# Patient Record
Sex: Female | Born: 1975 | Race: Black or African American | Hispanic: No | Marital: Single | Smoking: Never smoker
Health system: Southern US, Community
[De-identification: ages and names within clinical notes are randomized; demographics above are authoritative.]

---

## 2016-05-29 ENCOUNTER — Encounter (HOSPITAL_COMMUNITY): Payer: Self-pay | Admitting: *Deleted

## 2016-05-29 ENCOUNTER — Emergency Department (HOSPITAL_COMMUNITY)
Admission: EM | Admit: 2016-05-29 | Discharge: 2016-05-29 | Disposition: A | Discharge status: Home / Self Care | Payer: Self-pay | Attending: Emergency Medicine | Admitting: Emergency Medicine

## 2016-05-29 ENCOUNTER — Emergency Department (HOSPITAL_COMMUNITY): Payer: Self-pay

## 2016-05-29 ENCOUNTER — Other Ambulatory Visit: Payer: Self-pay

## 2016-05-29 DIAGNOSIS — Z791 Long term (current) use of non-steroidal anti-inflammatories (NSAID): Secondary | ICD-10-CM | POA: Insufficient documentation

## 2016-05-29 DIAGNOSIS — K219 Gastro-esophageal reflux disease without esophagitis: Secondary | ICD-10-CM

## 2016-05-29 DIAGNOSIS — N760 Acute vaginitis: Secondary | ICD-10-CM | POA: Insufficient documentation

## 2016-05-29 DIAGNOSIS — K029 Dental caries, unspecified: Secondary | ICD-10-CM

## 2016-05-29 DIAGNOSIS — R0789 Other chest pain: Secondary | ICD-10-CM

## 2016-05-29 DIAGNOSIS — B9689 Other specified bacterial agents as the cause of diseases classified elsewhere: Secondary | ICD-10-CM

## 2016-05-29 LAB — COMPREHENSIVE METABOLIC PANEL
ALT: 16 U/L (ref 14–54)
ANION GAP: 6 (ref 5–15)
AST: 15 U/L (ref 15–41)
Albumin: 3.7 g/dL (ref 3.5–5.0)
Alkaline Phosphatase: 33 U/L — ABNORMAL LOW (ref 38–126)
BUN: 17 mg/dL (ref 6–20)
CHLORIDE: 105 mmol/L (ref 101–111)
CO2: 25 mmol/L (ref 22–32)
CREATININE: 0.82 mg/dL (ref 0.44–1.00)
Calcium: 8.7 mg/dL — ABNORMAL LOW (ref 8.9–10.3)
Glucose, Bld: 99 mg/dL (ref 65–99)
POTASSIUM: 3.7 mmol/L (ref 3.5–5.1)
SODIUM: 136 mmol/L (ref 135–145)
Total Bilirubin: 0.7 mg/dL (ref 0.3–1.2)
Total Protein: 6.9 g/dL (ref 6.5–8.1)

## 2016-05-29 LAB — GC/CHLAMYDIA PROBE AMP (~~LOC~~) NOT AT ARMC
CHLAMYDIA, DNA PROBE: NEGATIVE
NEISSERIA GONORRHEA: NEGATIVE

## 2016-05-29 LAB — URINALYSIS, ROUTINE W REFLEX MICROSCOPIC
BILIRUBIN URINE: NEGATIVE
Glucose, UA: NEGATIVE mg/dL
Ketones, ur: NEGATIVE mg/dL
LEUKOCYTES UA: NEGATIVE
NITRITE: NEGATIVE
PH: 7 (ref 5.0–8.0)
Protein, ur: NEGATIVE mg/dL
Specific Gravity, Urine: 1.017 (ref 1.005–1.030)

## 2016-05-29 LAB — CBC
HCT: 36.7 % (ref 36.0–46.0)
HEMOGLOBIN: 12.1 g/dL (ref 12.0–15.0)
MCH: 26.8 pg (ref 26.0–34.0)
MCHC: 33 g/dL (ref 30.0–36.0)
MCV: 81.4 fL (ref 78.0–100.0)
PLATELETS: 211 10*3/uL (ref 150–400)
RBC: 4.51 MIL/uL (ref 3.87–5.11)
RDW: 14 % (ref 11.5–15.5)
WBC: 5.6 10*3/uL (ref 4.0–10.5)

## 2016-05-29 LAB — WET PREP, GENITAL
Sperm: NONE SEEN
TRICH WET PREP: NONE SEEN
YEAST WET PREP: NONE SEEN

## 2016-05-29 LAB — URINE MICROSCOPIC-ADD ON

## 2016-05-29 LAB — I-STAT TROPONIN, ED: TROPONIN I, POC: 0 ng/mL (ref 0.00–0.08)

## 2016-05-29 LAB — I-STAT BETA HCG BLOOD, ED (MC, WL, AP ONLY)

## 2016-05-29 MED ORDER — OMEPRAZOLE 20 MG PO CPDR: 20.0000 mg | DELAYED_RELEASE_CAPSULE | Freq: Every day | ORAL | Status: AC

## 2016-05-29 MED ORDER — AZITHROMYCIN 250 MG PO TABS
1000.0000 mg | ORAL_TABLET | Freq: Once | ORAL | Status: AC
  Administered 2016-05-29: 1000 mg via ORAL
  Filled 2016-05-29: qty 4

## 2016-05-29 MED ORDER — ACETAMINOPHEN 325 MG PO TABS
650.0000 mg | ORAL_TABLET | Freq: Once | ORAL | Status: AC
  Administered 2016-05-29: 650 mg via ORAL
  Filled 2016-05-29: qty 2

## 2016-05-29 MED ORDER — METRONIDAZOLE 500 MG PO TABS: 500.0000 mg | ORAL_TABLET | Freq: Two times a day (BID) | ORAL | Status: DC

## 2016-05-29 MED ORDER — CEFTRIAXONE SODIUM 1 G IJ SOLR
500.0000 mg | Freq: Once | INTRAMUSCULAR | Status: AC
  Administered 2016-05-29: 500 mg via INTRAMUSCULAR
  Filled 2016-05-29: qty 10

## 2016-05-29 MED ORDER — PENICILLIN V POTASSIUM 500 MG PO TABS: 500.0000 mg | ORAL_TABLET | Freq: Four times a day (QID) | ORAL | Status: AC

## 2016-05-29 MED ORDER — GI COCKTAIL ~~LOC~~
30.0000 mL | Freq: Once | ORAL | Status: AC
  Administered 2016-05-29: 30 mL via ORAL
  Filled 2016-05-29: qty 30

## 2016-05-29 NOTE — ED Notes (Signed)
Pelvic cart ready at bedside 

## 2016-05-29 NOTE — Discharge Instructions (Signed)
In regards to your dental pain, you will need to see a dentist for follow-up regarding definitive management. Please take tylenol for pain control. Take penicillin for potential underlying infection  In regards to your chest pain, your x-ray, blood work and heart conduction looks normal. It may be related to heart burn. Decrease your intake of aleve/ibuprofen and only take with food/crackers. You are started on prilosec.  We sent pelvic testing on you. Take antibiotics for BV. Return for fever, worsening pain, or any other symptoms concerning to you.  Bacterial Vaginosis Bacterial vaginosis is a vaginal infection that occurs when the normal balance of bacteria in the vagina is disrupted. It results from an overgrowth of certain bacteria. This is the most common vaginal infection in women of childbearing age. Treatment is important to prevent complications, especially in pregnant women, as it can cause a premature delivery. CAUSES  Bacterial vaginosis is caused by an increase in harmful bacteria that are normally present in smaller amounts in the vagina. Several different kinds of bacteria can cause bacterial vaginosis. However, the reason that the condition develops is not fully understood. RISK FACTORS Certain activities or behaviors can put you at an increased risk of developing bacterial vaginosis, including:  Having a new sex partner or multiple sex partners.  Douching.  Using an intrauterine device (IUD) for contraception. Women do not get bacterial vaginosis from toilet seats, bedding, swimming pools, or contact with objects around them. SIGNS AND SYMPTOMS  Some women with bacterial vaginosis have no signs or symptoms. Common symptoms include:  Grey vaginal discharge.  A fishlike odor with discharge, especially after sexual intercourse.  Itching or burning of the vagina and vulva.  Burning or pain with urination. DIAGNOSIS  Your health care provider will take a medical history and  examine the vagina for signs of bacterial vaginosis. A sample of vaginal fluid may be taken. Your health care provider will look at this sample under a microscope to check for bacteria and abnormal cells. A vaginal pH test may also be done.  TREATMENT  Bacterial vaginosis may be treated with antibiotic medicines. These may be given in the form of a pill or a vaginal cream. A second round of antibiotics may be prescribed if the condition comes back after treatment. Because bacterial vaginosis increases your risk for sexually transmitted diseases, getting treated can help reduce your risk for chlamydia, gonorrhea, HIV, and herpes. HOME CARE INSTRUCTIONS   Only take over-the-counter or prescription medicines as directed by your health care provider.  If antibiotic medicine was prescribed, take it as directed. Make sure you finish it even if you start to feel better.  Tell all sexual partners that you have a vaginal infection. They should see their health care provider and be treated if they have problems, such as a mild rash or itching.  During treatment, it is important that you follow these instructions:  Avoid sexual activity or use condoms correctly.  Do not douche.  Avoid alcohol as directed by your health care provider.  Avoid breastfeeding as directed by your health care provider. SEEK MEDICAL CARE IF:   Your symptoms are not improving after 3 days of treatment.  You have increased discharge or pain.  You have a fever. MAKE SURE YOU:   Understand these instructions.  Will watch your condition.  Will get help right away if you are not doing well or get worse. FOR MORE INFORMATION  Centers for Disease Control and Prevention, Division of STD Prevention: [employer] American  Sexual Health Association (ASHA): www.ashastd.org    This information is not intended to replace advice given to you by your health care provider. Make sure you discuss any questions you have with your  health care provider.   Document Released: 11/25/2005 Document Revised: 12/16/2014 Document Reviewed: 07/07/2013 Elsevier Interactive Patient Education 2016 Elsevier Inc.  Dental Caries Dental caries (also called tooth decay) is the most common oral disease. It can occur at any age but is more common in children and young adults.  HOW DENTAL CARIES DEVELOPS  The process of decay begins when bacteria and foods (particularly sugars and starches) combine in your mouth to produce plaque. Plaque is a substance that sticks to the hard, outer surface of a tooth (enamel). The bacteria in plaque produce acids that attack enamel. These acids may also attack the root surface of a tooth (cementum) if it is exposed. Repeated attacks dissolve these surfaces and create holes in the tooth (cavities). If left untreated, the acids destroy the other layers of the tooth.  RISK FACTORS  Frequent sipping of sugary beverages.   Frequent snacking on sugary and starchy foods, especially those that easily get stuck in the teeth.   Poor oral hygiene.   Dry mouth.   Substance abuse such as methamphetamine abuse.   Broken or poor-fitting dental restorations.   Eating disorders.   Gastroesophageal reflux disease (GERD).   Certain radiation treatments to the head and neck. SYMPTOMS In the early stages of dental caries, symptoms are seldom present. Sometimes white, chalky areas may be seen on the enamel or other tooth layers. In later stages, symptoms may include:  Pits and holes on the enamel.  Toothache after sweet, hot, or cold foods or drinks are consumed.  Pain around the tooth.  Swelling around the tooth. DIAGNOSIS  Most of the time, dental caries is detected during a regular dental checkup. A diagnosis is made after a thorough medical and dental history is taken and the surfaces of your teeth are checked for signs of dental caries. Sometimes special instruments, such as lasers, are used to  check for dental caries. Dental X-ray exams may be taken so that areas not visible to the eye (such as between the contact areas of the teeth) can be checked for cavities.  TREATMENT  If dental caries is in its early stages, it may be reversed with a fluoride treatment or an application of a remineralizing agent at the dental office. Thorough brushing and flossing at home is needed to aid these treatments. If it is in its later stages, treatment depends on the location and extent of tooth destruction:   If a small area of the tooth has been destroyed, the destroyed area will be removed and cavities will be filled with a material such as gold, silver amalgam, or composite resin.   If a large area of the tooth has been destroyed, the destroyed area will be removed and a cap (crown) will be fitted over the remaining tooth structure.   If the center part of the tooth (pulp) is affected, a procedure called a root canal will be needed before a filling or crown can be placed.   If most of the tooth has been destroyed, the tooth may need to be pulled (extracted). HOME CARE INSTRUCTIONS You can prevent, stop, or reverse dental caries at home by practicing good oral hygiene. Good oral hygiene includes:  Thoroughly cleaning your teeth at least twice a day with a toothbrush and dental floss.  Using a fluoride toothpaste. A fluoride mouth rinse may also be used if recommended by your dentist or health care provider.   Restricting the amount of sugary and starchy foods and sugary liquids you consume.   Avoiding frequent snacking on these foods and sipping of these liquids.   Keeping regular visits with a dentist for checkups and cleanings. PREVENTION   Practice good oral hygiene.  Consider a dental sealant. A dental sealant is a coating material that is applied by your dentist to the pits and grooves of teeth. The sealant prevents food from being trapped in them. It may protect the teeth for  several years.  Ask about fluoride supplements if you live in a community without fluorinated water or with water that has a low fluoride content. Use fluoride supplements as directed by your dentist or health care provider.  Allow fluoride varnish applications to teeth if directed by your dentist or health care provider.   This information is not intended to replace advice given to you by your health care provider. Make sure you discuss any questions you have with your health care provider.   Document Released: 08/17/2002 Document Revised: 12/16/2014 Document Reviewed: 11/27/2012 Elsevier Interactive Patient Education 2016 Elsevier Inc.  Heartburn Heartburn is a type of pain or discomfort that can happen in the throat or chest. It is often described as a burning pain. It may also cause a bad taste in the mouth. Heartburn may feel worse when you lie down or bend over, and it is often worse at night. Heartburn may be caused by stomach contents that move back up into the esophagus (reflux). HOME CARE INSTRUCTIONS Take these actions to decrease your discomfort and to help avoid complications. Diet  Follow a diet as recommended by your health care provider. This may involve avoiding foods and drinks such as:  Coffee and tea (with or without caffeine).  Drinks that contain alcohol.  Energy drinks and sports drinks.  Carbonated drinks or sodas.  Chocolate and cocoa.  Peppermint and mint flavorings.  Garlic and onions.  Horseradish.  Spicy and acidic foods, including peppers, chili powder, curry powder, vinegar, hot sauces, and barbecue sauce.  Citrus fruit juices and citrus fruits, such as oranges, lemons, and limes.  Tomato-based foods, such as red sauce, chili, salsa, and pizza with red sauce.  Fried and fatty foods, such as donuts, french fries, potato chips, and high-fat dressings.  High-fat meats, such as hot dogs and fatty cuts of red and white meats, such as rib eye  steak, sausage, ham, and bacon.  High-fat dairy items, such as whole milk, butter, and cream cheese.  Eat small, frequent meals instead of large meals.  Avoid drinking large amounts of liquid with your meals.  Avoid eating meals during the 2-3 hours before bedtime.  Avoid lying down right after you eat.  Do not exercise right after you eat. General Instructions  Pay attention to any changes in your symptoms.  Take over-the-counter and prescription medicines only as told by your health care provider. Do not take aspirin, ibuprofen, or other NSAIDs unless your health care provider told you to do so.  Do not use any tobacco products, including cigarettes, chewing tobacco, and e-cigarettes. If you need help quitting, ask your health care provider.  Wear loose-fitting clothing. Do not wear anything tight around your waist that causes pressure on your abdomen.  Raise (elevate) the head of your bed about 6 inches (15 cm).  Try to reduce your stress, such  as with yoga or meditation. If you need help reducing stress, ask your health care provider.  If you are overweight, reduce your weight to an amount that is healthy for you. Ask your health care provider for guidance about a safe weight loss goal.  Keep all follow-up visits as told by your health care provider. This is important. SEEK MEDICAL CARE IF:  You have new symptoms.  You have unexplained weight loss.  You have difficulty swallowing, or it hurts to swallow.  You have wheezing or a persistent cough.  Your symptoms do not improve with treatment.  You have frequent heartburn for more than two weeks. SEEK IMMEDIATE MEDICAL CARE IF:  You have pain in your arms, neck, jaw, teeth, or back.  You feel sweaty, dizzy, or light-headed.  You have chest pain or shortness of breath.  You vomit and your vomit looks like blood or coffee grounds.  Your stool is bloody or black.   This information is not intended to replace  advice given to you by your health care provider. Make sure you discuss any questions you have with your health care provider.   Document Released: 04/13/2009 Document Revised: 08/16/2015 Document Reviewed: 03/22/2015 Elsevier Interactive Patient Education 2016 Elsevier Inc.  Nonspecific Chest Pain It is often hard to find the cause of chest pain. There is always a chance that your pain could be related to something serious, such as a heart attack or a blood clot in your lungs. Chest pain can also be caused by conditions that are not life-threatening. If you have chest pain, it is very important to follow up with your doctor.  HOME CARE  If you were prescribed an antibiotic medicine, finish it all even if you start to feel better.  Avoid any activities that cause chest pain.  Do not use any tobacco products, including cigarettes, chewing tobacco, or electronic cigarettes. If you need help quitting, ask your doctor.  Do not drink alcohol.  Take medicines only as told by your doctor.  Keep all follow-up visits as told by your doctor. This is important. This includes any further testing if your chest pain does not go away.  Your doctor may tell you to keep your head raised (elevated) while you sleep.  Make lifestyle changes as told by your doctor. These may include:  Getting regular exercise. Ask your doctor to suggest some activities that are safe for you.  Eating a heart-healthy diet. Your doctor or a diet specialist (dietitian) can help you to learn healthy eating options.  Maintaining a healthy weight.  Managing diabetes, if necessary.  Reducing stress. GET HELP IF:  Your chest pain does not go away, even after treatment.  You have a rash with blisters on your chest.  You have a fever. GET HELP RIGHT AWAY IF:  Your chest pain is worse.  You have an increasing cough, or you cough up blood.  You have severe belly (abdominal) pain.  You feel extremely weak.  You  pass out (faint).  You have chills.  You have sudden, unexplained chest discomfort.  You have sudden, unexplained discomfort in your arms, back, neck, or jaw.  You have shortness of breath at any time.  You suddenly start to sweat, or your skin gets clammy.  You feel nauseous.  You vomit.  You suddenly feel light-headed or dizzy.  Your heart begins to beat quickly, or it feels like it is skipping beats. These symptoms may be an emergency. Do not wait to  see if the symptoms will go away. Get medical help right away. Call your local emergency services (911 in the [place] Do not drive yourself to the hospital.   This information is not intended to replace advice given to you by your health care provider. Make sure you discuss any questions you have with your health care provider.   Document Released: 05/13/2008 Document Revised: 12/16/2014 Document Reviewed: 07/01/2014 Elsevier Interactive Patient Education [employer]2016 Elsevier Inc.

## 2016-05-29 NOTE — ED Notes (Addendum)
Pt presents to ED with multiple complaints; she reports right upper dental pain for which she has been taking aleve without relief; this morning she states on her way to work she developed burning pain in central chest, sts she is unsure if it's heartburn, due to heavy meal she had night before. She also reports lower abdominal pain, as well as generalized body aches. She appears in no distress.

## 2016-05-29 NOTE — ED Provider Notes (Signed)
CSN: [id number]     Arrival date & time 05/29/16  0549 History   First MD Initiated Contact with Patient 05/29/16 0700     Chief Complaint  Patient presents with  . Chest Pain  . multiple complaints      (Consider location/radiation/quality/duration/timing/severity/associated sxs/prior Treatment) HPI  40 year old female, no significant past medical history, presenting with multiple complaints. Last night right dental pain followed by chest discomfort. Recently moved to the area and does not have a dentist. States after eating ice cream she began to develop dental pain in her right upper jaw. She took 2 tablets of Aleve prior to going to bed, and 2 tablets of Aleve during the night. States that around midnight she developed burning, squeezing chest discomfort while lying down. States that she felt short of breath and lightheaded. Symptoms worse with lying down. No exacerbated by activity. No nausea, vomiting, abdominal pain, fevers or chills, cough, syncope, diarrhea, melena or hematochezia.   Six days ago with intermittent pelvic discomfort. Notices pain intermittently after sexual intercourse. Is unsure if she has been exposed to STDs recently. No abnormal vaginal discharge or bleeding. No dysuria, urinary frequency, or hematuria.   History reviewed. No pertinent past medical history. History reviewed. No pertinent past surgical history. History reviewed. No pertinent family history. Social History  Substance Use Topics  . Smoking status: None  . Smokeless tobacco: None  . Alcohol Use: None   OB History    No data available     Review of Systems 10/14 systems reviewed and are negative other than those stated in the HPI    Allergies  Review of patient's allergies indicates no known allergies.  Home Medications   Prior to Admission medications   Medication Sig Start Date End Date Taking? Authorizing Provider  naproxen sodium (ANAPROX) 220 MG tablet Take 440 mg by mouth every  12 (twelve) hours as needed (for pain).   Yes Historical Provider, MD  metroNIDAZOLE (FLAGYL) 500 MG tablet Take 1 tablet (500 mg total) by mouth 2 (two) times daily. 05/29/16   Lavera Guise, MD  omeprazole (PRILOSEC) 20 MG capsule Take 1 capsule (20 mg total) by mouth daily. 05/29/16   Lavera Guise, MD   BP 114/61 mmHg  Pulse 63  Temp(Src) 98.4 F (36.9 C) (Oral)  Resp 18  Ht  (1.626 m)  Wt 180 lb (81.647 kg)  BMI 30.88 kg/m2  SpO2 94%  LMP 05/09/2016 Physical Exam Physical Exam  Nursing note and vitals reviewed. Constitutional: Well developed, well nourished, non-toxic, and in no acute distress Head: Normocephalic and atraumatic.  Mouth/Throat: Oropharynx is clear and moist. Severe avities noted over teeth #3 and #2 with tenderness to percussion Neck: Normal range of motion. Neck supple.  Cardiovascular: Normal rate and regular rhythm.   Pulmonary/Chest: Effort normal and breath sounds normal.  Abdominal: Soft. There is no tenderness. There is no rebound and no guarding.    Musculoskeletal: Normal range of motion.  Neurological: Alert, no facial droop, fluent speech, moves all extremities symmetrically Skin: Skin is warm and dry.  Psychiatric: Cooperative  ED Course  Procedures (including critical care time) Labs Review Labs Reviewed  WET PREP, GENITAL - Abnormal; Notable for the following:    Clue Cells Wet Prep HPF POC PRESENT (*)    WBC, Wet Prep HPF POC MANY (*)    All other components within normal limits  COMPREHENSIVE METABOLIC PANEL - Abnormal; Notable for the following:    Calcium 8.7 (*)  Alkaline Phosphatase 33 (*)    All other components within normal limits  URINALYSIS, ROUTINE W REFLEX MICROSCOPIC (NOT AT [facility]) - Abnormal; Notable for the following:    Hgb urine dipstick SMALL (*)    All other components within normal limits  URINE MICROSCOPIC-ADD ON - Abnormal; Notable for the following:    Squamous Epithelial / LPF 0-5 (*)    Bacteria, UA RARE  (*)    All other components within normal limits  CBC  I-STAT TROPOININ, ED  I-STAT BETA HCG BLOOD, ED ([facility], [facility], [facility] ONLY)  GC/CHLAMYDIA PROBE AMP ([place]) NOT AT [facility]    Imaging Review Dg Chest 2 View  05/29/2016  CLINICAL DATA:  40 year old female with chest pain EXAM: CHEST  2 VIEW COMPARISON:  None. FINDINGS: The heart size and mediastinal contours are within normal limits. Both lungs are clear. The visualized skeletal structures are unremarkable. IMPRESSION: No active cardiopulmonary disease. Electronically Signed   By: Elgie CollardArash  Radparvar M.D.   On: 05/29/2016 06:33   I have personally reviewed and evaluated these images and lab results as part of my medical decision-making.   EKG Interpretation   Date/Time:  Wednesday May 29 2016 05:59:57 EDT Ventricular Rate:  57 PR Interval:    QRS Duration: 112 QT Interval:  428 QTC Calculation: 417 R Axis:   -10 Text Interpretation:  Sinus rhythm Borderline intraventricular conduction  delay No acute ischemicchanges. No prior EKG for comparison  Confirmed by  Eliav Mechling MD, Ross Hefferan [phone]([id number]) on 05/29/2016 7:02:18 AM      MDM   Final diagnoses:  Atypical chest pain  Gastroesophageal reflux disease, esophagitis presence not specified  Pain due to dental caries  Bacterial vaginosis   Presenting with multiple complaints. In regards to her dental pain, she has dental caries throughout, but notable ones on tooth #2 and tooth #3 where she is having her pain. There is tenderness to percussion, without gingival swelling or obvious abscess. Discussed need for dental follow-up for this.  Unable to rule out apical abscess so will give course of PCN.  Her chest pain seems atypical for ACS. No risk factors. Heart score 0. Troponin negative, EKG non-ischemic, and CXR normal. No suspicious history of PE, dissection, or other emergent intrathoracic or cardiopulmoary process. Chest pain seems more suggestive of likely GERD/GI process, especially in setting of  occuring after taking multiple doses of NSAIDs. Given GI cocktail.   In regards to pelvic pain, intermittent since 6 days ago. UA and pelvic exam performed. Abdomen soft and benign. UA negative. Wet prep positive for BV and many WBCs. Request empiric treatment for STDs. No cervical motion tenderness or suspicion for PID at this time. Given flagyl, ceftriaxone IM x 1 and azithromycin x 1.   Lavera Guiseana Duo Keldric Poyer, MD 05/29/16 [phone]

## 2016-07-15 ENCOUNTER — Encounter (HOSPITAL_COMMUNITY): Payer: Self-pay | Admitting: Emergency Medicine

## 2016-07-15 ENCOUNTER — Emergency Department (HOSPITAL_COMMUNITY)
Admission: EM | Admit: 2016-07-15 | Discharge: 2016-07-15 | Disposition: A | Discharge status: Home / Self Care | Payer: Self-pay | Attending: Emergency Medicine | Admitting: Emergency Medicine

## 2016-07-15 ENCOUNTER — Emergency Department (HOSPITAL_COMMUNITY): Payer: Self-pay

## 2016-07-15 DIAGNOSIS — R079 Chest pain, unspecified: Secondary | ICD-10-CM | POA: Insufficient documentation

## 2016-07-15 LAB — COMPREHENSIVE METABOLIC PANEL
ALBUMIN: 3.8 g/dL (ref 3.5–5.0)
ALK PHOS: 38 U/L (ref 38–126)
ALT: 21 U/L (ref 14–54)
AST: 18 U/L (ref 15–41)
Anion gap: 6 (ref 5–15)
BUN: 13 mg/dL (ref 6–20)
CALCIUM: 9.1 mg/dL (ref 8.9–10.3)
CO2: 27 mmol/L (ref 22–32)
CREATININE: 1.05 mg/dL — AB (ref 0.44–1.00)
Chloride: 104 mmol/L (ref 101–111)
GFR calc Af Amer: >60 mL/min (ref >60)
GFR calc non Af Amer: >60 mL/min (ref >60)
GLUCOSE: 96 mg/dL (ref 65–99)
Potassium: 3.6 mmol/L (ref 3.5–5.1)
SODIUM: 137 mmol/L (ref 135–145)
Total Bilirubin: 0.4 mg/dL (ref 0.3–1.2)
Total Protein: 7 g/dL (ref 6.5–8.1)

## 2016-07-15 LAB — CBC WITH DIFFERENTIAL/PLATELET
BASOS PCT: 0 %
Basophils Absolute: 0 10*3/uL (ref 0.0–0.1)
EOS ABS: 0.3 10*3/uL (ref 0.0–0.7)
Eosinophils Relative: 4 %
HCT: 39.2 % (ref 36.0–46.0)
HEMOGLOBIN: 12.2 g/dL (ref 12.0–15.0)
LYMPHS ABS: 2.6 10*3/uL (ref 0.7–4.0)
Lymphocytes Relative: 33 %
MCH: 26 pg (ref 26.0–34.0)
MCHC: 31.1 g/dL (ref 30.0–36.0)
MCV: 83.6 fL (ref 78.0–100.0)
Monocytes Absolute: 0.9 10*3/uL (ref 0.1–1.0)
Monocytes Relative: 11 %
NEUTROS PCT: 52 %
Neutro Abs: 4.1 10*3/uL (ref 1.7–7.7)
Platelets: 252 10*3/uL (ref 150–400)
RBC: 4.69 MIL/uL (ref 3.87–5.11)
RDW: 14.4 % (ref 11.5–15.5)
WBC: 7.9 10*3/uL (ref 4.0–10.5)

## 2016-07-15 LAB — D-DIMER, QUANTITATIVE: D-Dimer, Quant: 0.3 ug/mL-FEU (ref 0.00–0.50)

## 2016-07-15 LAB — TROPONIN I: Troponin I: <0.03 ng/mL (ref <0.03)

## 2016-07-15 LAB — I-STAT BETA HCG BLOOD, ED (MC, WL, AP ONLY): I-stat hCG, quantitative: <5 m[IU]/mL (ref <5)

## 2016-07-15 LAB — LIPASE, BLOOD: Lipase: 30 U/L (ref 11–51)

## 2016-07-15 MED ORDER — ONDANSETRON HCL 4 MG/2ML IJ SOLN
4.0000 mg | Freq: Once | INTRAMUSCULAR | Status: AC
  Administered 2016-07-15: 4 mg via INTRAVENOUS
  Filled 2016-07-15: qty 2

## 2016-07-15 MED ORDER — ONDANSETRON HCL 4 MG PO TABS
4.0000 mg | ORAL_TABLET | Freq: Four times a day (QID) | ORAL | 0 refills | Status: AC
Start: 2016-07-15 — End: ?

## 2016-07-15 MED ORDER — RANITIDINE HCL 150 MG PO TABS: 150.0000 mg | ORAL_TABLET | Freq: Two times a day (BID) | ORAL | 0 refills | Status: AC

## 2016-07-15 MED ORDER — HYDROMORPHONE HCL 1 MG/ML IJ SOLN
1.0000 mg | Freq: Once | INTRAMUSCULAR | Status: DC
  Filled 2016-07-15: qty 1

## 2016-07-15 MED ORDER — TRAMADOL HCL 50 MG PO TABS: 50.0000 mg | ORAL_TABLET | Freq: Four times a day (QID) | ORAL | 0 refills | Status: AC | PRN

## 2016-07-15 MED ORDER — KETOROLAC TROMETHAMINE 30 MG/ML IJ SOLN
30.0000 mg | Freq: Once | INTRAMUSCULAR | Status: AC
  Administered 2016-07-15: 30 mg via INTRAVENOUS
  Filled 2016-07-15: qty 1

## 2016-07-15 NOTE — ED Triage Notes (Addendum)
Pt in with chest pain that began yesterday at work around noon. Pain is L sided, throbbing and 10/10. Pt states she's had dyspnea since start of pain. Denies n/v, no cardiac hx, pt is non-smoker. Pt reports high stress levels at home, has had anxiety lately

## 2016-07-15 NOTE — ED Provider Notes (Addendum)
[facility]-EMERGENCY DEPT Provider Note   CSN: [id number] Arrival date & time: 07/15/16  0507  First Provider Contact:  First MD Initiated Contact with Patient 07/15/16 0510        History   Chief Complaint Chief Complaint  Patient presents with  . Chest Pain    HPI Stefanie Cooper is a 40 y.o. female.  Patient presents to the emergency department for evaluation of chest pain. Patient reports that she started to have nausea prior to lunch yesterday. She tried to eat lunch but was not hungry and could not eat much. She then started having left-sided chest pain. Pain has been continuous since it started at lunchtime yesterday. Patient reports that she feels short of breath and the pain does worsen when she takes a deep breath. No history of DVT or recent travel. She is not on birth control pills.  Patient denies family history of heart disease. She does not have any known history of heart disease.      History reviewed. No pertinent past medical history.  There are no active problems to display for this patient.   History reviewed. No pertinent surgical history.  OB History    No data available       Home Medications    Prior to Admission medications   Medication Sig Start Date End Date Taking? Authorizing Provider  naproxen sodium (ANAPROX) 220 MG tablet Take 440 mg by mouth every 12 (twelve) hours as needed (for pain).   Yes Historical Provider, MD  omeprazole (PRILOSEC) 20 MG capsule Take 1 capsule (20 mg total) by mouth daily. 05/29/16  Yes Lavera Guiseana Duo Liu, MD    Family History No family history on file.  Social History Social History  Substance Use Topics  . Smoking status: Never Smoker  . Smokeless tobacco: Never Used  . Alcohol use Yes     Comment: occasional, socially     Allergies   Review of patient's allergies indicates no known allergies.   Review of Systems Review of Systems  Respiratory: Positive for shortness of breath.   Cardiovascular: Positive for  chest pain.  All other systems reviewed and are negative.    Physical Exam Updated Vital Signs LMP 07/01/2016   Physical Exam  Constitutional: She is oriented to person, place, and time. She appears well-developed and well-nourished. No distress.  HENT:  Head: Normocephalic and atraumatic.  Right Ear: Hearing normal.  Left Ear: Hearing normal.  Nose: Nose normal.  Mouth/Throat: Oropharynx is clear and moist and mucous membranes are normal.  Eyes: Conjunctivae and EOM are normal. Pupils are equal, round, and reactive to light.  Neck: Normal range of motion. Neck supple.  Cardiovascular: Regular rhythm, S1 normal and S2 normal.  Exam reveals no gallop and no friction rub.   No murmur heard. Pulmonary/Chest: Effort normal and breath sounds normal. No respiratory distress. She exhibits no tenderness.  Abdominal: Soft. Normal appearance and bowel sounds are normal. There is no hepatosplenomegaly. There is no tenderness. There is no rebound, no guarding, no tenderness at McBurney's point and negative Murphy's sign. No hernia.  Musculoskeletal: Normal range of motion.  Neurological: She is alert and oriented to person, place, and time. She has normal strength. No cranial nerve deficit or sensory deficit. Coordination normal. GCS eye subscore is 4. GCS verbal subscore is 5. GCS motor subscore is 6.  Skin: Skin is warm, dry and intact. No rash noted. No cyanosis.  Psychiatric: She has a normal mood and affect. Her speech is  normal and behavior is normal. Thought content normal.  Nursing note and vitals reviewed.    ED Treatments / Results  Labs (all labs ordered are listed, but only abnormal results are displayed) Labs Reviewed  COMPREHENSIVE METABOLIC PANEL - Abnormal; Notable for the following:       Result Value   Creatinine, Ser 1.05 (*)    All other components within normal limits  CBC WITH DIFFERENTIAL/PLATELET  TROPONIN I  LIPASE, BLOOD  D-DIMER, QUANTITATIVE (NOT AT [facility])    I-STAT BETA HCG BLOOD, ED ([facility], [facility], [facility] ONLY)    EKG  EKG Interpretation  Date/Time:  Monday July 15 2016 05:15:22 EDT Ventricular Rate:  75 PR Interval:    QRS Duration: 112 QT Interval:  378 QTC Calculation: 423 R Axis:   -15 Text Interpretation:  Sinus rhythm Borderline intraventricular conduction delay Abnormal R-wave progression, early transition Otherwise within normal limits No significant change since last tracing Confirmed by Deeric Cruise  MD, Sabrin Dunlevy ([id number]) on 07/15/2016 5:21:52 AM       Radiology No results found.  Procedures Procedures (including critical care time)  Medications Ordered in ED Medications  HYDROmorphone (DILAUDID) injection 1 mg (1 mg Intravenous Not Given 07/15/16 0548)  ketorolac (TORADOL) 30 MG/ML injection 30 mg (30 mg Intravenous Given 07/15/16 0540)  ondansetron (ZOFRAN) injection 4 mg (4 mg Intravenous Given 07/15/16 0540)     Initial Impression / Assessment and Plan / ED Course  I have reviewed the triage vital signs and the nursing notes.  Pertinent labs & imaging results that were available during my care of the patient were reviewed by me and considered in my medical decision making (see chart for details).  Clinical Course  chest pain  Patient presents to the emergency department for evaluation of chest pain. Patient reports that pain began prior to lunch yesterday. She has had continuous pain since its onset. She does not have any significant cardiac risk factors.  HEART Score for Major Cardiac Events from [email or web address]  on 07/15/2016 ** All calculations should be rechecked by clinician prior to use **  RESULT SUMMARY: 1 points Low Score (0-3 points)  Risk of MACE of 0.9-1.7%.   INPUTS: History -> 0 = Slightly suspicious EKG -> 0 = Normal Age -> 0 = <45 Risk factors -> 1 = 1-2 risk factors (obesity) Initial troponin -> 0 = =normal limit  Since the pain has been present for so long, she does not require repeat troponin. Patient  also felt to be unlikely to have PE. Vital signs are normal, no tachycardia or hypoxia. D-dimer negative.  Symptoms did begin around the time she lunch yesterday and she has had nausea. She does, however, have a benign abdominal exam. No signs of peritonitis. No right upper quadrant tenderness or Murphy sign. LFTs and lipase are normal.  Final Clinical Impressions(s) / ED Diagnoses   Final diagnoses:  Chest pain, unspecified chest pain type    New Prescriptions New Prescriptions   No medications on file     Gilda Crease, MD 07/15/16 1308    Gilda Crease, MD 07/15/16 [phone]

## 2017-06-19 IMAGING — CR DG CHEST 2V
2 series · 2 of 2 positions shown · non-contrast
Comparison: Chest radiograph performed 05/29/2016

CLINICAL DATA: Acute onset of left-sided chest pain and shortness
of breath. Initial encounter.

EXAM:
CHEST  2 VIEW

[chest pa]
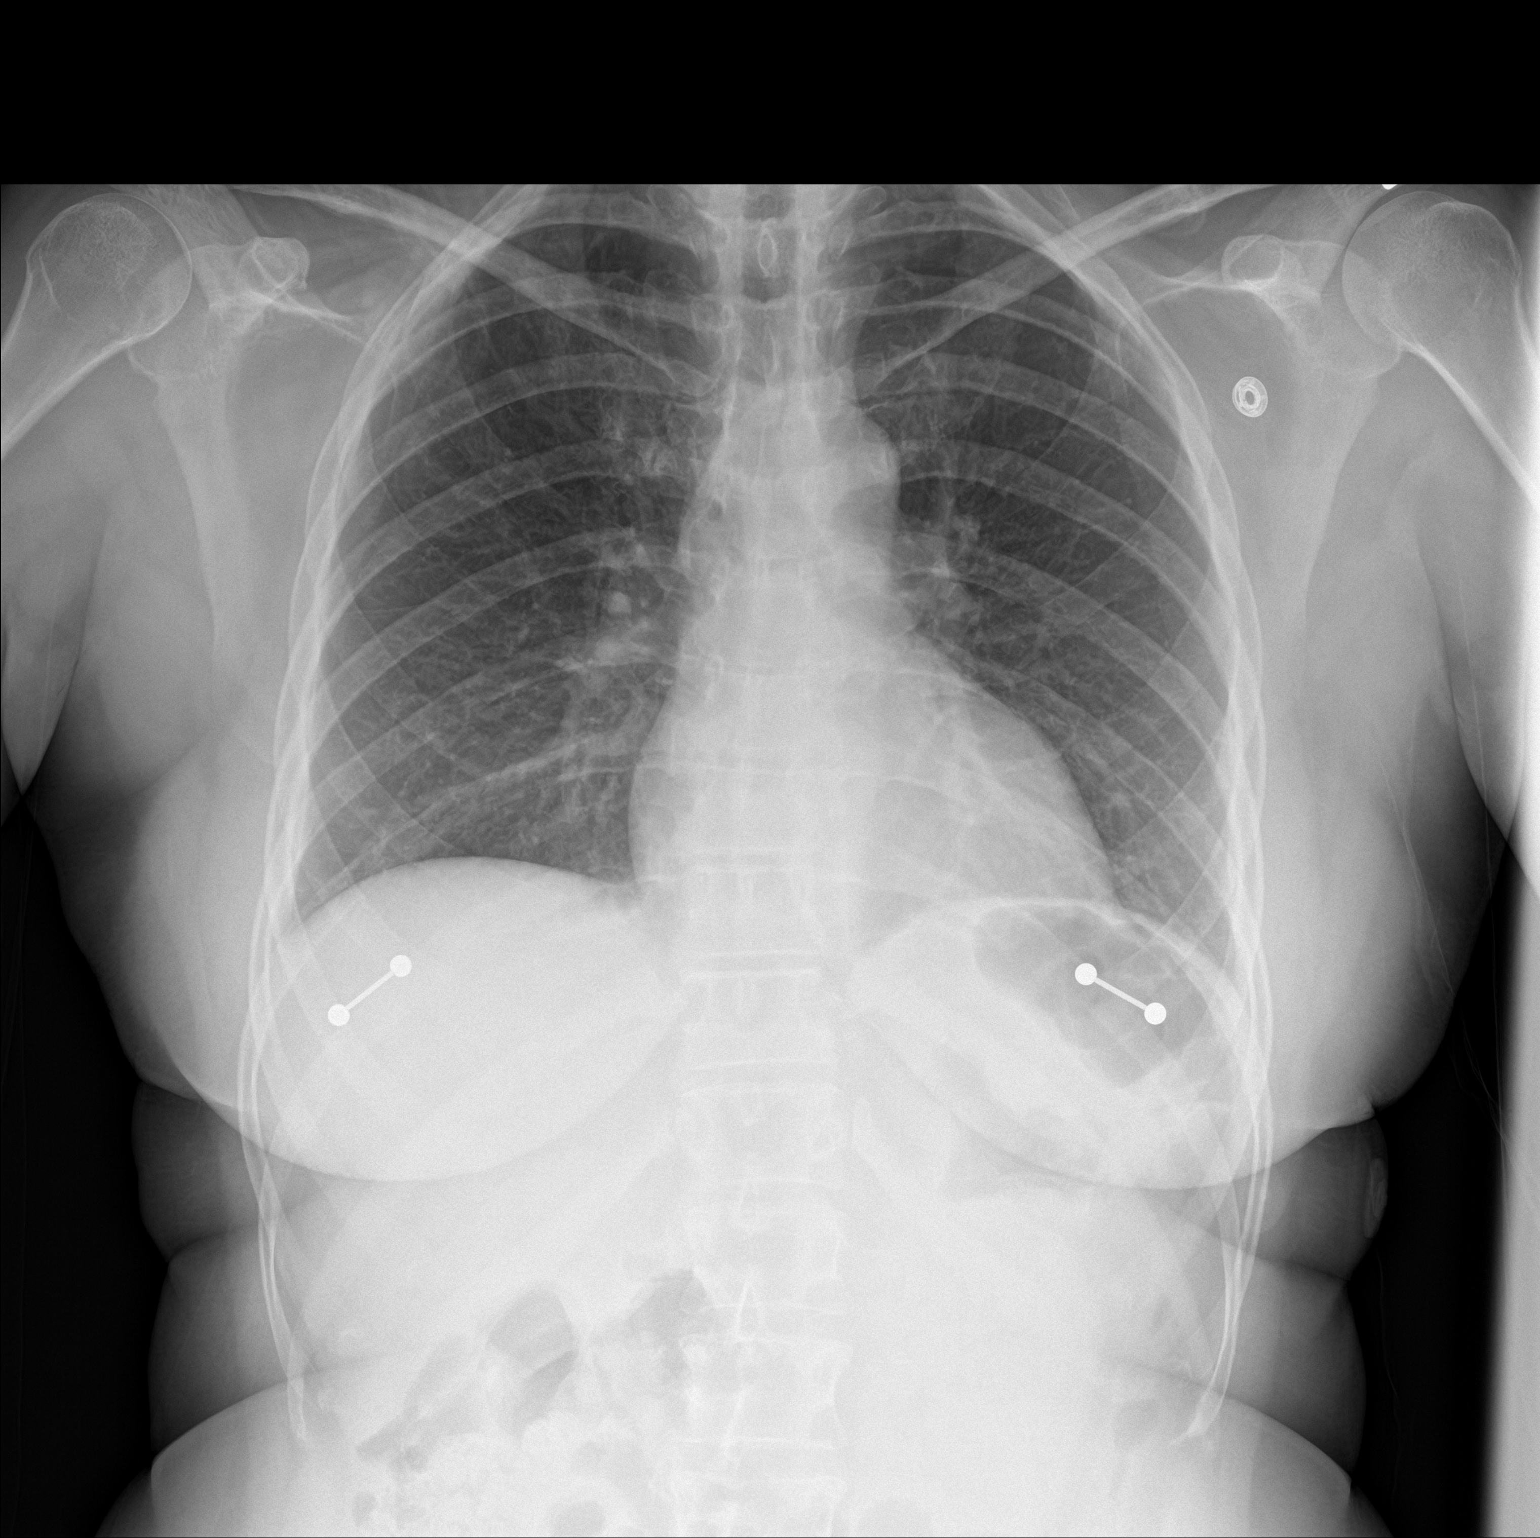

[chest lat]
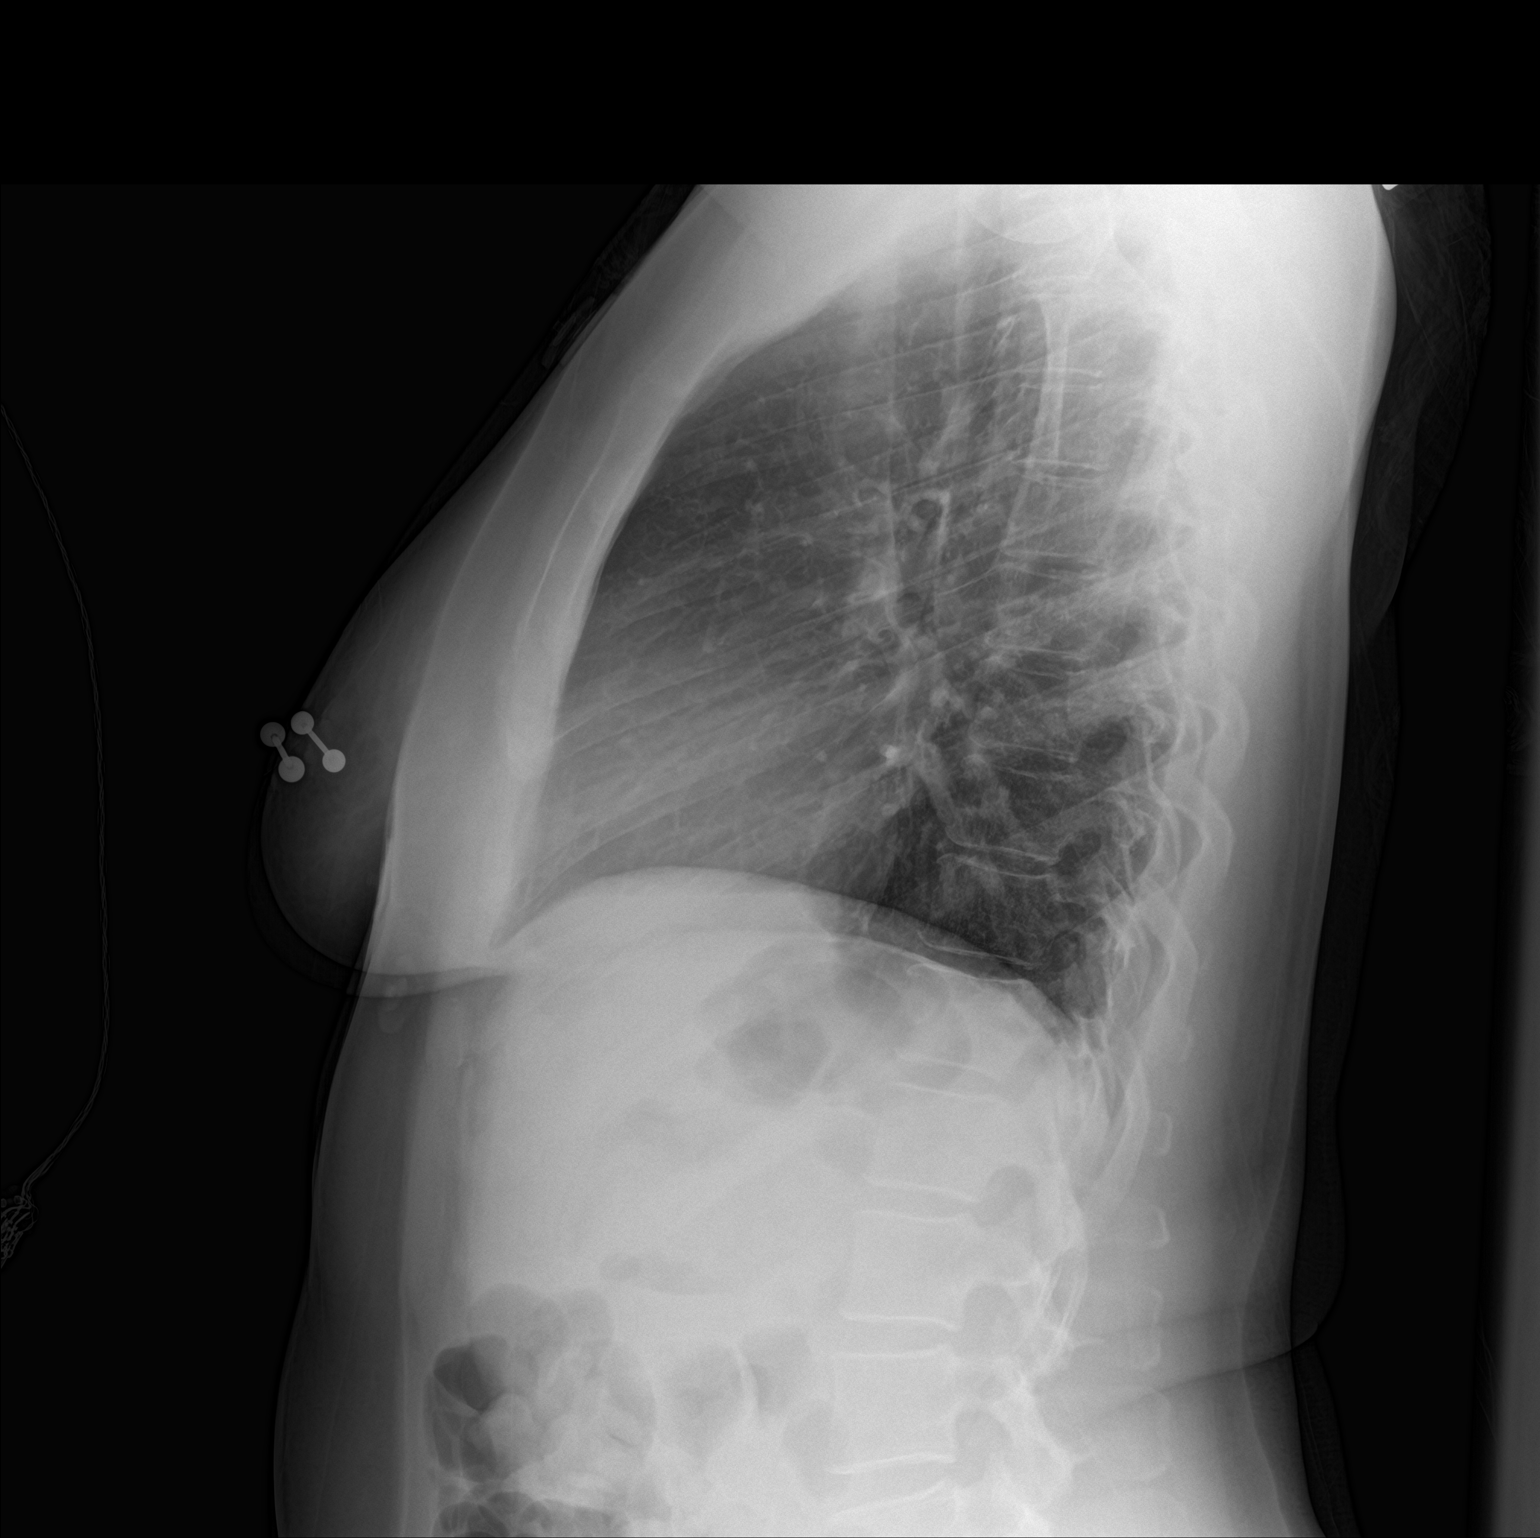

[2 of 2 positions shown; findings below may reference images not displayed]

FINDINGS: The lungs are well-aerated and clear. There is no evidence of focal
opacification, pleural effusion or pneumothorax.

The heart is normal in size; the mediastinal contour is within
normal limits. No acute osseous abnormalities are seen. Bilateral
metallic nipple piercings are noted.
IMPRESSION: No acute cardiopulmonary process seen.
